# Patient Record
Sex: Female | Born: 1990 | Race: White | Hispanic: No | Marital: Single | Smoking: Former smoker
Health system: Southern US, Community
[De-identification: ages and names within clinical notes are randomized; demographics above are authoritative.]

## PROBLEM LIST (undated history)

## (undated) DIAGNOSIS — F410 Panic disorder [episodic paroxysmal anxiety] without agoraphobia: Secondary | ICD-10-CM

## (undated) DIAGNOSIS — D649 Anemia, unspecified: Secondary | ICD-10-CM

## (undated) DIAGNOSIS — R011 Cardiac murmur, unspecified: Secondary | ICD-10-CM

## (undated) DIAGNOSIS — F419 Anxiety disorder, unspecified: Secondary | ICD-10-CM

## (undated) DIAGNOSIS — R000 Tachycardia, unspecified: Secondary | ICD-10-CM

## (undated) DIAGNOSIS — F32A Depression, unspecified: Secondary | ICD-10-CM

## (undated) DIAGNOSIS — F329 Major depressive disorder, single episode, unspecified: Secondary | ICD-10-CM

## (undated) DIAGNOSIS — T7840XA Allergy, unspecified, initial encounter: Secondary | ICD-10-CM

## (undated) HISTORY — DX: Allergy, unspecified, initial encounter: T78.40XA

## (undated) HISTORY — DX: Major depressive disorder, single episode, unspecified: F32.9

## (undated) HISTORY — DX: Depression, unspecified: F32.A

## (undated) HISTORY — DX: Tachycardia, unspecified: R00.0

## (undated) HISTORY — DX: Anemia, unspecified: D64.9

## (undated) HISTORY — DX: Cardiac murmur, unspecified: R01.1

---
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
## 2012-03-16 ENCOUNTER — Ambulatory Visit (INDEPENDENT_AMBULATORY_CARE_PROVIDER_SITE_OTHER): Payer: PRIVATE HEALTH INSURANCE | Admitting: Physician Assistant

## 2012-03-16 VITALS — BP 102/58 | HR 98 | Temp 99.0°F | Resp 18 | Ht 65.5 in | Wt 148.6 lb

## 2012-03-16 DIAGNOSIS — J020 Streptococcal pharyngitis: Secondary | ICD-10-CM

## 2012-03-16 DIAGNOSIS — J029 Acute pharyngitis, unspecified: Secondary | ICD-10-CM

## 2012-03-16 DIAGNOSIS — R109 Unspecified abdominal pain: Secondary | ICD-10-CM

## 2012-03-16 DIAGNOSIS — R509 Fever, unspecified: Secondary | ICD-10-CM

## 2012-03-16 LAB — POCT URINALYSIS DIPSTICK
Bilirubin, UA: NEGATIVE
Blood, UA: NEGATIVE
Glucose, UA: NEGATIVE
Ketones, UA: NEGATIVE
Leukocytes, UA: NEGATIVE
Nitrite, UA: NEGATIVE
Protein, UA: NEGATIVE
Spec Grav, UA: 1.02
Urobilinogen, UA: 0.2
pH, UA: 8

## 2012-03-16 LAB — POCT UA - MICROSCOPIC ONLY
Bacteria, U Microscopic: NEGATIVE
Casts, Ur, LPF, POC: NEGATIVE
Crystals, Ur, HPF, POC: NEGATIVE
Mucus, UA: NEGATIVE
Yeast, UA: NEGATIVE

## 2012-03-16 LAB — POCT CBC
Granulocyte percent: 77.2 %G (ref 37–80)
HCT, POC: 41.5 % (ref 37.7–47.9)
Hemoglobin: 12.8 g/dL (ref 12.2–16.2)
Lymph, poc: 1.9 (ref 0.6–3.4)
MCH, POC: 29.1 pg (ref 27–31.2)
MCHC: 30.8 g/dL — AB (ref 31.8–35.4)
MCV: 94.3 fL (ref 80–97)
MID (cbc): 0.8 (ref 0–0.9)
MPV: 7.9 fL (ref 0–99.8)
POC Granulocyte: 9.1 — AB (ref 2–6.9)
POC LYMPH PERCENT: 16.1 %L (ref 10–50)
POC MID %: 6.7 %M (ref 0–12)
Platelet Count, POC: 286 10*3/uL (ref 142–424)
RBC: 4.4 M/uL (ref 4.04–5.48)
RDW, POC: 13.6 %
WBC: 11.8 10*3/uL — AB (ref 4.6–10.2)

## 2012-03-16 LAB — POCT RAPID STREP A (OFFICE): Rapid Strep A Screen: POSITIVE — AB

## 2012-03-16 MED ORDER — AMOXICILLIN 875 MG PO TABS: 875.0000 mg | ORAL_TABLET | Freq: Two times a day (BID) | ORAL | Status: DC

## 2012-03-16 NOTE — Patient Instructions (Signed)
Take Motrin or Tylenol as needed for fever and body aches Mucinex twice daily for congestion Return if symptoms worsen or fail to improve, especially if abdominal pain worsens

## 2012-03-16 NOTE — Progress Notes (Signed)
Subjective:    Patient ID: Kara Downs, female    DOB: [birth year], 22 y.o.   MRN: [id number]  HPI 22 year old female presents with 4 day history of sore throat, slight nasal congestion, cough, nausea, and diffuse abdominal tenderness.  Complains of tonsillar swelling with right sided otalgia and popping.  Complains of headache and chills with body aches.  Denies nausea, sinus pain, dizziness, or SOB.  She does have a history of strep infections with the last about 1 year ago.  Admits that her tonsils are always enlarged and she has considered having them out, but has not seen an ENT about it yet.  She has taken Tylenol Cold & Sinus which has helped some with her congestion.    History of anxiety/depression treated with Ativan prn which she admits has offered better control than she previously had.      Review of Systems  Constitutional: Positive for fever and chills.  HENT: Positive for congestion, sore throat, rhinorrhea and postnasal drip. Negative for ear pain, neck pain and sinus pressure.   Respiratory: Positive for cough. Negative for shortness of breath and wheezing.   Gastrointestinal: Positive for nausea and abdominal pain. Negative for vomiting.  Neurological: Negative for dizziness.       Objective:   Physical Exam  Constitutional: She is oriented to person, place, and time. She appears well-developed and well-nourished.  HENT:  Head: Normocephalic and atraumatic.  Right Ear: Hearing, tympanic membrane, external ear and ear canal normal.  Left Ear: Hearing, tympanic membrane, external ear and ear canal normal.  Mouth/Throat: Uvula is midline and mucous membranes are normal. Oropharyngeal exudate and posterior oropharyngeal erythema (3+ tonsillar swelling) present. No posterior oropharyngeal edema or tonsillar abscesses.  Eyes: Conjunctivae are normal.  Neck: Normal range of motion. Neck supple.  Cardiovascular: Normal rate, regular rhythm and normal heart sounds.    Pulmonary/Chest: Effort normal and breath sounds normal.  Abdominal: Soft. Bowel sounds are normal. There is no hepatosplenomegaly. There is tenderness (diffuse abdominal tenderness). There is no rebound and no guarding.  Lymphadenopathy:    She has cervical adenopathy.  Neurological: She is alert and oriented to person, place, and time.  Psychiatric: She has a normal mood and affect. Her behavior is normal. Judgment and thought content normal.     Results for orders placed in visit on 03/16/12  POCT CBC      Result Value Range   WBC 11.8 (*) 4.6 - 10.2 K/uL   Lymph, poc 1.9  0.6 - 3.4   POC LYMPH PERCENT 16.1  10 - 50 %L   MID (cbc) 0.8  0 - 0.9   POC MID % 6.7  0 - 12 %M   POC Granulocyte 9.1 (*) 2 - 6.9   Granulocyte percent 77.2  37 - 80 %G   RBC 4.40  4.04 - 5.48 M/uL   Hemoglobin 12.8  12.2 - 16.2 g/dL   HCT, POC 40.9  81.1 - 47.9 %   MCV 94.3  80 - 97 fL   MCH, POC 29.1  27 - 31.2 pg   MCHC 30.8 (*) 31.8 - 35.4 g/dL   RDW, POC 91.4     Platelet Count, POC 286  142 - 424 K/uL   MPV 7.9  0 - 99.8 fL  POCT RAPID STREP A (OFFICE)      Result Value Range   Rapid Strep A Screen Positive (*) Negative  POCT URINALYSIS DIPSTICK      Result Value  Range   Color, UA yellow     Clarity, UA clear     Glucose, UA neg     Bilirubin, UA neg     Ketones, UA neg     Spec Grav, UA 1.020     Blood, UA neg     pH, UA 8.0     Protein, UA neg     Urobilinogen, UA 0.2     Nitrite, UA neg     Leukocytes, UA Negative    POCT UA - MICROSCOPIC ONLY      Result Value Range   WBC, Ur, HPF, POC 0-1     RBC, urine, microscopic 0-1     Bacteria, U Microscopic neg     Mucus, UA neg     Epithelial cells, urine per micros 0-1     Crystals, Ur, HPF, POC neg     Casts, Ur, LPF, POC neg     Yeast, UA neg          Assessment & Plan:  1. Streptococcal sore throat - Plan: amoxicillin (AMOXIL) 875 MG tablet, Ambulatory referral to ENT  -Amoxicillin 875 mg bid x 10 days  -Referral to ENT   2. Fever, unspecified - Plan: POCT CBC  -Tylenol or ibuprofen prn chills/body aches 3. Acute pharyngitis - Plan: POCT CBC, POCT rapid strep A, Ambulatory referral to ENT 4. Abdominal pain, unspecified site - Plan: POCT urinalysis dipstick, POCT UA - Microscopic Only  -Likely secondary to strep infection.    -RTC or go to ER with any worsening symptoms

## 2012-04-24 ENCOUNTER — Encounter: Payer: Self-pay | Admitting: Physician Assistant

## 2012-04-24 ENCOUNTER — Ambulatory Visit: Payer: PRIVATE HEALTH INSURANCE

## 2012-04-25 ENCOUNTER — Ambulatory Visit (INDEPENDENT_AMBULATORY_CARE_PROVIDER_SITE_OTHER): Payer: PRIVATE HEALTH INSURANCE | Admitting: Physician Assistant

## 2012-04-25 VITALS — BP 106/70 | HR 82 | Temp 98.1°F | Resp 16 | Ht 65.75 in | Wt 152.6 lb

## 2012-04-25 DIAGNOSIS — J029 Acute pharyngitis, unspecified: Secondary | ICD-10-CM

## 2012-04-25 DIAGNOSIS — J020 Streptococcal pharyngitis: Secondary | ICD-10-CM

## 2012-04-25 DIAGNOSIS — J309 Allergic rhinitis, unspecified: Secondary | ICD-10-CM

## 2012-04-25 LAB — POCT RAPID STREP A (OFFICE): Rapid Strep A Screen: POSITIVE — AB

## 2012-04-25 MED ORDER — FLUTICASONE PROPIONATE 50 MCG/ACT NA SUSP: 2.0000 | Freq: Every day | NASAL | Status: DC

## 2012-04-25 MED ORDER — CETIRIZINE HCL 10 MG PO TABS: 10.0000 mg | ORAL_TABLET | Freq: Every day | ORAL | Status: DC

## 2012-04-25 MED ORDER — PENICILLIN G BENZATHINE 1200000 UNIT/2ML IM SUSP
1.2000 10*6.[IU] | Freq: Once | INTRAMUSCULAR | Status: AC
  Administered 2012-04-25: 1.2 10*6.[IU] via INTRAMUSCULAR

## 2012-04-25 NOTE — Progress Notes (Signed)
Subjective:    Patient ID: Kara Downs, female    DOB: 1990-09-25, 22 y.o.   MRN: [id number]  HPI   Ms. Scahill is a 22 yr old female here with concern for illness.  States "I felt like death when I woke up."  Complains of sore throat, nasal congestion, post-nasal drainage, cough.  "Feels like I'm coughing up liquid."  Denies SOB or wheezing.  Feels fatigued.  "I always have muscle aches."  Denies ear pain or headache.  Does endorse nausea but no vomiting.  Denies sick contacts.  States that she has allergies but does not take any medication for this.  States she was running a fever today but does not have a thermometer and did not take her temp.  States she "definitely felt warm" though.    Of note, pt treated here for strep approx 5 weeks ago.  Pt states she did get completely better from this episode.  States she did take all of her medication, but then admits that she did not take it as directed.  Missed doses "here and there", but eventually took all of the pills.    Review of Systems  Constitutional: Positive for fever (subjective, not measured). Negative for chills.  HENT: Positive for congestion, sore throat, rhinorrhea and postnasal drip. Negative for ear pain.   Respiratory: Positive for cough. Negative for shortness of breath and wheezing.   Cardiovascular: Negative.   Gastrointestinal: Positive for nausea. Negative for vomiting, abdominal pain and diarrhea.  Musculoskeletal: Positive for myalgias.  Skin: Negative.   Allergic/Immunologic: Positive for environmental allergies.  Neurological: Negative.        Objective:   Physical Exam  Vitals reviewed. Constitutional: She is oriented to person, place, and time. She appears well-developed and well-nourished. No distress.  HENT:  Head: Normocephalic and atraumatic.  Right Ear: Tympanic membrane and ear canal normal.  Left Ear: Tympanic membrane and ear canal normal.  Nose: Mucosal edema and rhinorrhea present.  Mouth/Throat: Uvula  is midline and mucous membranes are normal. Oropharyngeal exudate (small amount of white tonsillar exudate) and posterior oropharyngeal erythema present. No posterior oropharyngeal edema or tonsillar abscesses.  Eyes: Conjunctivae are normal. No scleral icterus.  Neck: Neck supple.  Cardiovascular: Normal rate, regular rhythm and normal heart sounds.  Exam reveals no gallop and no friction rub.   No murmur heard. Pulmonary/Chest: Effort normal and breath sounds normal. She has no wheezes. She has no rales.  Lymphadenopathy:    She has no cervical adenopathy.  Neurological: She is alert and oriented to person, place, and time.  Skin: Skin is warm and dry.  Psychiatric: She has a normal mood and affect. Her behavior is normal.     Filed Vitals:   04/25/12 2031  BP: 106/70  Pulse: 82  Temp: 98.1 F (36.7 C)  Resp: 16     Results for orders placed in visit on 04/25/12  POCT RAPID STREP A (OFFICE)      Result Value Range   Rapid Strep A Screen Positive (*) Negative        Assessment & Plan:  Strep pharyngitis - Plan: penicillin g benzathine (BICILLIN LA) 1200000 UNIT/2ML injection 1.2 Million Units  -- Pt with 1 day of sore throat and URI symptoms.  History of strep, most recently 03/16/12.  May have been incompletely treated at that time as pt admits to not taking meds as prescribed.  Will treat with pen g x1 in clinic today.  It is also possible that  pt may be a carrier of strep.  I have not done a throat culture today, but will have pt RTC in about 10 days when asymptomatic to perform throat cx at that time to confirm eradication of infection or carrier state.  Encouraged pt to practice good hand hygiene to prevent spread.  New tooth brush.  Push fluids, rest.  Work note given.    Sore throat - Plan: POCT rapid strep A  -- See above.  Possible that this is multifactorial.  May have allergic component given pt's other allergic symptoms.    Allergic rhinitis - Plan: fluticasone  (FLONASE) 50 MCG/ACT nasal spray, cetirizine (ZYRTEC) 10 MG tablet  --  Pt admits to untreated allergies.  Will start  Zyrtec and Flonase.    Discussed RTC precautions with pt who understand and is in agreement.

## 2012-04-25 NOTE — Patient Instructions (Addendum)
We have given you an injection of antibiotics in clinic today, so this should take care of your throat infection.  Make sure you are practicing good hand hygiene, not sharing food or drink.  Get a new tooth brush.  Come back here in about 10 days when your symptoms are completely gone so we can test to see if you are a carrier of strep.    Begin taking Zyrtec once daily for allergies.  Also begin using the Flonase daily to help control allergy symptoms.     Strep Throat Strep throat is an infection of the throat caused by a bacteria named Streptococcus pyogenes. Your caregiver may call the infection streptococcal "tonsillitis" or "pharyngitis" depending on whether there are signs of inflammation in the tonsils or back of the throat. Strep throat is most common in children from 88 to 55 years old during the cold months of the year, but it can occur in people of any age during any season. This infection is spread from person to person (contagious) through coughing, sneezing, or other close contact. SYMPTOMS   Fever or chills.  Painful, swollen, red tonsils or throat.  Pain or difficulty when swallowing.  White or yellow spots on the tonsils or throat.  Swollen, tender lymph nodes or "glands" of the neck or under the jaw.  Red rash all over the body (rare). DIAGNOSIS  Many different infections can cause the same symptoms. A test must be done to confirm the diagnosis so the right treatment can be given. A "rapid strep test" can help your caregiver make the diagnosis in a few minutes. If this test is not available, a light swab of the infected area can be used for a throat culture test. If a throat culture test is done, results are usually available in a day or two. TREATMENT  Strep throat is treated with antibiotic medicine. HOME CARE INSTRUCTIONS   Gargle with 1 tsp of salt in 1 cup of warm water, 3 to 4 times per day or as needed for comfort.  Family members who also have a sore throat or  fever should be tested for strep throat and treated with antibiotics if they have the strep infection.  Make sure everyone in your household washes their hands well.  Do not share food, drinking cups, or personal items that could cause the infection to spread to others.  You may need to eat a soft food diet until your sore throat gets better.  Drink enough water and fluids to keep your urine clear or pale yellow. This will help prevent dehydration.  Get plenty of rest.  Stay home from school, daycare, or work until you have been on antibiotics for 24 hours.  Only take over-the-counter or prescription medicines for pain, discomfort, or fever as directed by your caregiver.  If antibiotics are prescribed, take them as directed. Finish them even if you start to feel better. SEEK MEDICAL CARE IF:   The glands in your neck continue to enlarge.  You develop a rash, cough, or earache.  You cough up green, yellow-brown, or bloody sputum.  You have pain or discomfort not controlled by medicines.  Your problems seem to be getting worse rather than better. SEEK IMMEDIATE MEDICAL CARE IF:   You develop any new symptoms such as vomiting, severe headache, stiff or painful neck, chest pain, shortness of breath, or trouble swallowing.  You develop severe throat pain, drooling, or changes in your voice.  You develop swelling of the neck,  or the skin on the neck becomes red and tender.  You have a fever.  You develop signs of dehydration, such as fatigue, dry mouth, and decreased urination.  You become increasingly sleepy, or you cannot wake up completely. Document Released: 12/19/1999 Document Revised: 03/15/2011 Document Reviewed: 02/19/2010 [facility] Patient Information 2013 [place], [place].    Allergic Rhinitis Allergic rhinitis is when the mucous membranes in the nose respond to allergens. Allergens are particles in the air that cause your body to have an allergic reaction. This  causes you to release allergic antibodies. Through a chain of events, these eventually cause you to release histamine into the blood stream (hence the use of antihistamines). Although meant to be protective to the body, it is this release that causes your discomfort, such as frequent sneezing, congestion and an itchy runny nose.  CAUSES  The pollen allergens may come from grasses, trees, and weeds. This is seasonal allergic rhinitis, or "hay fever." Other allergens cause year-round allergic rhinitis (perennial allergic rhinitis) such as house dust mite allergen, pet dander and mold spores.  SYMPTOMS   Nasal stuffiness (congestion).  Runny, itchy nose with sneezing and tearing of the eyes.  There is often an itching of the mouth, eyes and ears. It cannot be cured, but it can be controlled with medications. DIAGNOSIS  If you are unable to determine the offending allergen, skin or blood testing may find it. TREATMENT   Avoid the allergen.  Medications and allergy shots (immunotherapy) can help.  Hay fever may often be treated with antihistamines in pill or nasal spray forms. Antihistamines block the effects of histamine. There are over-the-counter medicines that may help with nasal congestion and swelling around the eyes. Check with your caregiver before taking or giving this medicine. If the treatment above does not work, there are many new medications your caregiver can prescribe. Stronger medications may be used if initial measures are ineffective. Desensitizing injections can be used if medications and avoidance fails. Desensitization is when a patient is given ongoing shots until the body becomes less sensitive to the allergen. Make sure you follow up with your caregiver if problems continue. SEEK MEDICAL CARE IF:   You develop fever (more than 100.5 F (38.1 C).  You develop a cough that does not stop easily (persistent).  You have shortness of breath.  You start  wheezing.  Symptoms interfere with normal daily activities. Document Released: 09/15/2000 Document Revised: 03/15/2011 Document Reviewed: 03/27/2008 [facility] Patient Information 2013 [place], [place].

## 2012-05-02 ENCOUNTER — Ambulatory Visit (INDEPENDENT_AMBULATORY_CARE_PROVIDER_SITE_OTHER): Payer: PRIVATE HEALTH INSURANCE | Admitting: Physician Assistant

## 2012-05-02 VITALS — BP 112/74 | HR 76 | Temp 98.8°F | Resp 16 | Ht 66.5 in | Wt 155.0 lb

## 2012-05-02 DIAGNOSIS — B373 Candidiasis of vulva and vagina: Secondary | ICD-10-CM

## 2012-05-02 DIAGNOSIS — Z202 Contact with and (suspected) exposure to infections with a predominantly sexual mode of transmission: Secondary | ICD-10-CM

## 2012-05-02 DIAGNOSIS — N898 Other specified noninflammatory disorders of vagina: Secondary | ICD-10-CM

## 2012-05-02 DIAGNOSIS — B3731 Acute candidiasis of vulva and vagina: Secondary | ICD-10-CM

## 2012-05-02 DIAGNOSIS — R300 Dysuria: Secondary | ICD-10-CM

## 2012-05-02 DIAGNOSIS — Z2089 Contact with and (suspected) exposure to other communicable diseases: Secondary | ICD-10-CM

## 2012-05-02 DIAGNOSIS — Z862 Personal history of diseases of the blood and blood-forming organs and certain disorders involving the immune mechanism: Secondary | ICD-10-CM

## 2012-05-02 LAB — POCT CBC
Granulocyte percent: 67.1 %G (ref 37–80)
HCT, POC: 40.9 % (ref 37.7–47.9)
Hemoglobin: 12.6 g/dL (ref 12.2–16.2)
Lymph, poc: 1.7 (ref 0.6–3.4)
MCH, POC: 28.7 pg (ref 27–31.2)
MCHC: 30.8 g/dL — AB (ref 31.8–35.4)
MCV: 93.1 fL (ref 80–97)
MID (cbc): 0.5 (ref 0–0.9)
MPV: 7.6 fL (ref 0–99.8)
POC Granulocyte: 4.4 (ref 2–6.9)
POC LYMPH PERCENT: 25.1 %L (ref 10–50)
POC MID %: 7.8 %M (ref 0–12)
Platelet Count, POC: 314 10*3/uL (ref 142–424)
RBC: 4.39 M/uL (ref 4.04–5.48)
RDW, POC: 13.5 %
WBC: 6.6 10*3/uL (ref 4.6–10.2)

## 2012-05-02 LAB — RPR

## 2012-05-02 LAB — POCT URINALYSIS DIPSTICK
Bilirubin, UA: NEGATIVE
Glucose, UA: NEGATIVE
Nitrite, UA: NEGATIVE

## 2012-05-02 LAB — POCT WET PREP WITH KOH
Clue Cells Wet Prep HPF POC: NEGATIVE
KOH Prep POC: POSITIVE
RBC Wet Prep HPF POC: NEGATIVE
Trichomonas, UA: NEGATIVE
Yeast Wet Prep HPF POC: POSITIVE

## 2012-05-02 LAB — POCT UA - MICROSCOPIC ONLY
Mucus, UA: POSITIVE
Yeast, UA: NEGATIVE

## 2012-05-02 LAB — HIV ANTIBODY (ROUTINE TESTING W REFLEX): HIV: NONREACTIVE

## 2012-05-02 MED ORDER — FLUCONAZOLE 150 MG PO TABS: 150.0000 mg | ORAL_TABLET | Freq: Once | ORAL | Status: DC
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
## 2012-05-03 LAB — GC/CHLAMYDIA PROBE AMP
CT Probe RNA: NEGATIVE
GC Probe RNA: NEGATIVE

## 2012-05-04 LAB — URINE CULTURE
Colony Count: NO GROWTH
Organism ID, Bacteria: NO GROWTH

## 2012-05-04 LAB — CULTURE, GROUP A STREP: Organism ID, Bacteria: NORMAL

## 2012-08-09 ENCOUNTER — Encounter (HOSPITAL_COMMUNITY): Payer: Self-pay | Admitting: Emergency Medicine

## 2012-08-09 ENCOUNTER — Emergency Department (HOSPITAL_COMMUNITY)
Admission: EM | Admit: 2012-08-09 | Discharge: 2012-08-10 | Disposition: A | Discharge status: Home / Self Care | Payer: PRIVATE HEALTH INSURANCE | Attending: Emergency Medicine | Admitting: Emergency Medicine

## 2012-08-09 DIAGNOSIS — Z8659 Personal history of other mental and behavioral disorders: Secondary | ICD-10-CM | POA: Insufficient documentation

## 2012-08-09 DIAGNOSIS — Z87891 Personal history of nicotine dependence: Secondary | ICD-10-CM | POA: Insufficient documentation

## 2012-08-09 DIAGNOSIS — S01511A Laceration without foreign body of lip, initial encounter: Secondary | ICD-10-CM

## 2012-08-09 DIAGNOSIS — Z91040 Latex allergy status: Secondary | ICD-10-CM | POA: Insufficient documentation

## 2012-08-09 DIAGNOSIS — Z8679 Personal history of other diseases of the circulatory system: Secondary | ICD-10-CM | POA: Insufficient documentation

## 2012-08-09 DIAGNOSIS — S01501A Unspecified open wound of lip, initial encounter: Secondary | ICD-10-CM | POA: Insufficient documentation

## 2012-08-09 DIAGNOSIS — W540XXA Bitten by dog, initial encounter: Secondary | ICD-10-CM | POA: Insufficient documentation

## 2012-08-09 DIAGNOSIS — F411 Generalized anxiety disorder: Secondary | ICD-10-CM | POA: Insufficient documentation

## 2012-08-09 DIAGNOSIS — R011 Cardiac murmur, unspecified: Secondary | ICD-10-CM | POA: Insufficient documentation

## 2012-08-09 DIAGNOSIS — Y9389 Activity, other specified: Secondary | ICD-10-CM | POA: Insufficient documentation

## 2012-08-09 DIAGNOSIS — F410 Panic disorder [episodic paroxysmal anxiety] without agoraphobia: Secondary | ICD-10-CM | POA: Insufficient documentation

## 2012-08-09 DIAGNOSIS — Z23 Encounter for immunization: Secondary | ICD-10-CM | POA: Insufficient documentation

## 2012-08-09 DIAGNOSIS — Z79899 Other long term (current) drug therapy: Secondary | ICD-10-CM | POA: Insufficient documentation

## 2012-08-09 DIAGNOSIS — R209 Unspecified disturbances of skin sensation: Secondary | ICD-10-CM | POA: Insufficient documentation

## 2012-08-09 DIAGNOSIS — Z862 Personal history of diseases of the blood and blood-forming organs and certain disorders involving the immune mechanism: Secondary | ICD-10-CM | POA: Insufficient documentation

## 2012-08-09 DIAGNOSIS — Y929 Unspecified place or not applicable: Secondary | ICD-10-CM | POA: Insufficient documentation

## 2012-08-09 MED ORDER — TETANUS-DIPHTH-ACELL PERTUSSIS 5-2.5-18.5 LF-MCG/0.5 IM SUSP
0.5000 mL | Freq: Once | INTRAMUSCULAR | Status: AC
Start: 2012-08-09 — End: 2012-08-09
  Administered 2012-08-09: 0.5 mL via INTRAMUSCULAR
  Filled 2012-08-09: qty 0.5

## 2012-08-09 MED ORDER — IBUPROFEN 800 MG PO TABS
800.0000 mg | ORAL_TABLET | Freq: Once | ORAL | Status: AC
  Administered 2012-08-09: 800 mg via ORAL
  Filled 2012-08-09: qty 1

## 2012-08-09 NOTE — Consult Note (Signed)
Oral & Maxillofacial Surgery-Consult Note  Reason for Consult: Dog bite to left upper lip Referring Physician: Dr. Luiz Iron Tadros is an 22 y.o. female.  HPI: Kara Downs explains that she was visiting a friend and the friends [place] Retriever bit her upper lip.    PMHx:  Past Medical History  Diagnosis Date  . Panic attacks   . Anxiety   . Allergy   . Depression   . Anemia   . Heart murmur   . Tachycardia     PSx: History reviewed. No pertinent past surgical history.  Family Hx:  Family History  Problem Relation Age of Onset  . Migraines Mother   . Migraines Father   . Migraines Sister   . Migraines Brother   . Asthma Brother     Social Hx:  reports that she has quit smoking. She does not have any smokeless tobacco history on file. She reports that  drinks alcohol. She reports that she does not use illicit drugs.  Allergies:  Allergies  Allergen Reactions  . Latex Rash    Medications: I have reviewed the patient's current medications.  Labs: No results found for this or any previous visit (from the past 48 hour(s)).  Radiology: No results found.  WUJ:[id number] items are noted in HPI.  Vital Signs: BP 129/87  Pulse 100  Resp 19  SpO2 100%  Physical Exam: General appearance: alert and cooperative Head: Normocephalic, laceration of upper right lip involving vermillion border, avulsive injury (2 to  3 cm of tissue missing). The laceration extends down to muscle.  Assessment/Plan: Kara Downs has a 3 cm complex laceration/avulsion involving the vermilion border of the upper lip. 1. Lidocaine 2% with 1:100,000 epinephrine was injected into the labial mucosa - 10 mL. 2. Copious irrigation with Normal Saline. 3. Closure using 4-0 vicryl, 4-0 chromic, and 6-0 prolene sutures.   4. Bacitracin placed 5. Recommend follow up in one week, soft diet, and NO smoking.   6. Recommend the patient be covered with Antibiotic per ED.   Kara Downs,Kara Downs L  08/09/2012,  10:37 PM

## 2012-08-09 NOTE — ED Provider Notes (Signed)
Kara Downs is a 22 y.o. female who was bitten by a known dog. Animal control has been contacted. She cannot recall her last tetanus immunization. She complains of injuries to the upper lip only.  Exam:: Irregular laceration of the mid upper lip, there is a tissue defect, consistent with avulsion of tissue, approximately 1.5 by 1.5 cm, with associated flap lacerations, of the tissue, consisting of the dry area of the upper lip and crossing the vermilion border. This is a complex laceration. She resists exam making assessment for through and through laceration, difficult. There is also a small puncture adjacent to the left ala of the nose. TMJ function, and jaw motion is normal. There is no evident dental injury on a limited oral exam.  Assessment: Complex facial injury, requiring services of maxillofacial trauma for operative repair.  I discussed the case with Dr. Jeanice Lim (oral maxillofacial , trauma surgeon)  who will see the patient in the emergency department.  Medical screening examination/treatment/procedure(s) were conducted as a shared visit with non-physician practitioner(s) and myself.  I personally evaluated the patient during the encounter  Flint Melter, MD 08/10/12 [phone]

## 2012-08-09 NOTE — ED Provider Notes (Signed)
CSN: [id number]     Arrival date & time 08/09/12  1624 History    This chart was scribed for Raymon Mutton, PA working with Flint Melter, MD by Quintella Reichert, ED Scribe. This patient was seen in room WTR7/WTR7 and the patient's care was started at 6:06 PM.     Chief Complaint  Patient presents with  . Animal Bite    The history is provided by the patient. No language interpreter was used.    HPI Comments: Kara Downs is a 22 y.o. female who presents to the Emergency Department complaining of a facial laceration subsequent to an animal bite that she sustained 1 hour ago.  Pt was petting her friend's Location manager when it jumped up and bit her in the face.  The dog is up to date on all vaccinations and rabies shots.  Pt now presents with a moderately painful laceration to the left side of her upper lip.  She also reports numbness and tingling to the area.  She denies difficulty breathing, SOB, CP, or any other associated symptoms.  Her last tetanus vaccination was 8 years ago.   Past Medical History  Diagnosis Date  . Panic attacks   . Anxiety   . Allergy   . Depression   . Anemia   . Heart murmur   . Tachycardia     History reviewed. No pertinent past surgical history.   Family History  Problem Relation Age of Onset  . Migraines Mother   . Migraines Father   . Migraines Sister   . Migraines Brother   . Asthma Brother     History  Substance Use Topics  . Smoking status: Former Smoker -- 1.00 packs/day for 5 years  . Smokeless tobacco: Not on file  . Alcohol Use: Yes    OB History   Grav Para Term Preterm Abortions TAB SAB Ect Mult Living                   Review of Systems  Respiratory: Negative for shortness of breath.   Cardiovascular: Negative for chest pain.  Skin: Positive for wound.  Neurological: Positive for numbness.  All other systems reviewed and are negative.      Allergies  Latex  Home Medications   Current Outpatient Rx   Name  Route  Sig  Dispense  Refill  . LORazepam (ATIVAN) 1 MG tablet   Oral   Take 1 mg by mouth every 8 (eight) hours as needed for anxiety.          BP 129/87  Pulse 100  Resp 19  SpO2 100%  Physical Exam  Nursing note and vitals reviewed. Constitutional: She is oriented to person, place, and time. She appears well-developed and well-nourished. No distress.  HENT:  Head: Normocephalic.  Mouth/Throat:    Laceration to the left upper lip, affecting the vermilion border. Still currently bleeding. Two flaps of skin noted. Subcutaneous tissue exposed. Appears that some tissue is missing from the wound. Pain upon palpation. Decreased opening of jaw secondary to pain - negative pain upon palpation to jaw line bilaterally. Negative trismus. Negative damage to dentition.  Small abrasion noted to the left nasolabial fold.  Patient has two hoop piercings to lower lip    Eyes: Conjunctivae and EOM are normal. Pupils are equal, round, and reactive to light. Right eye exhibits no discharge. Left eye exhibits no discharge.  Neck: Normal range of motion. Neck supple. No tracheal deviation present.  Cardiovascular:  Normal rate, regular rhythm and normal heart sounds.   No murmur heard. Pulses:      Radial pulses are 2+ on the right side, and 2+ on the left side.  Pulmonary/Chest: Effort normal and breath sounds normal. No respiratory distress. She has no wheezes. She has no rales.  Musculoskeletal: Normal range of motion.  Neurological: She is alert and oriented to person, place, and time. No cranial nerve deficit or sensory deficit. She exhibits normal muscle tone. Coordination normal.  Cranial nerves III-XII grossly intact  Sensation intact to the face with differentiation to sharp and dull touch to the upper lip, bottom lip, face  Skin: Skin is warm and dry.  Psychiatric: Her behavior is normal.  Patient anxious and nervous regarding facial injury    ED Course  Procedures (including  critical care time)  DIAGNOSTIC STUDIES: Oxygen Saturation is 100% on room air, normal by my interpretation.    COORDINATION OF CARE: 6:12 PM-Discussed treatment plan which includes evaluation by attending provider with pt at bedside and pt agreed to plan.   6:20PM Spoke with Dr. Bernell List - went to go and assess patient - recommended Maxillofacial Trauma ENT - Dr. Bernell List to call Maxillofacial surgery.  Dr. Bernell List spoke with Dr. Jeanice Lim - maxillofacial - Dr. Jeanice Lim to see patient in ED for closure of wound.   Medications  TDaP (BOOSTRIX) injection 0.5 mL (0.5 mLs Intramuscular Given 08/09/12 1928)  ibuprofen (ADVIL,MOTRIN) tablet 800 mg (800 mg Oral Given 08/09/12 1928)    Labs Reviewed - No data to display  No results found.  No diagnosis found.  MDM  I personally performed the services described in this documentation, which was scribed in my presence. The recorded information has been reviewed and is accurate.  Patient presenting to the ED with animal bite, by friend's labrador, to the face that occurred approximately 1 hour and 30 minutes prior to ED arrival. Reported that she was petting the dog, first time meeting, put face near dogs face and dog bite patient. Reported instant pain and mild numbness.  Wound to the left side of the upper lift, affecting there vermilion border - appears to have tissue missing from site, flapped tissue noted. Swelling noted to the lip without ecchymosis. Limited opening of the mouth secondary to pain with pulling and tension of the upper lip - negative pain upon palpation to the jaw - negative trismus. Negative damage noted to dentition. Sensation intact with differentiation to sharp and dull touch - negative neurological deficits. Patient denied Tdap since 22 years old - Tdap administered. Bleeding under control with time - swelling has decreased with ice. Animal control has been notified about case. Dog has been vaccinated for rabies.  Dr. Bernell List  assessed patient - recommended maxillofacial surgery - Dr. Bernell List spoke with Dr. Jeanice Lim, Dr. Jeanice Lim to see patient in ED setting to close wound. Discussed with patient that she will most likely need a couple of procedures for the lip to look proper - discussed with patient that she will most likely be left with a scar.  Discussed case with Roxy Horseman, PA-C at change in shirt - transfer of care to Roxy Horseman, PA-C.   Raymon Mutton, PA-C 08/10/12 0136

## 2012-08-09 NOTE — ED Notes (Signed)
Pt was petting her friend's dog when the dog bit the pt's face. Pt has laceration on left side of upper lip.  Dog is up to date on all vaccines and rabies shots.

## 2012-08-10 MED ORDER — AMOXICILLIN-POT CLAVULANATE 875-125 MG PO TABS: 1.0000 | ORAL_TABLET | Freq: Two times a day (BID) | ORAL | Status: AC

## 2012-08-10 MED ORDER — IBUPROFEN 800 MG PO TABS: 800.0000 mg | ORAL_TABLET | Freq: Three times a day (TID) | ORAL | Status: AC

## 2012-08-10 NOTE — ED Provider Notes (Signed)
Patient signed out to me by [place], PA-C.    Plan is to have Dr. Jeanice Lim, from [facility] surgery see the patient.  12:04 AM Dr. Jeanice Lim has repaired the wound.  Recommends augmentin and something for pain.  Patient states that she does not want a narcotic, and is fine with ibuprofen.  Dr. Jeanice Lim recommends peroxide/water rinses, and would like the patient to follow up with him in a week.  Tdap is updated.  Dog had its rabies shots.  Patient is stable and ready for discharge.  Roxy Horseman, PA-C 08/10/12 [phone]

## 2012-08-10 NOTE — ED Provider Notes (Signed)
Medical screening examination/treatment/procedure(s) were conducted as a shared visit with non-physician practitioner(s) and myself.  I personally evaluated the patient during the encounter  Flint Melter, MD 08/10/12 1419

## 2012-08-10 NOTE — ED Provider Notes (Signed)
Medical screening examination/treatment/procedure(s) were conducted as a shared visit with non-physician practitioner(s) and myself.  I personally evaluated the patient during the encounter  Flint Melter, MD 08/10/12 [phone]

## 2014-03-18 ENCOUNTER — Other Ambulatory Visit: Payer: Self-pay | Admitting: Family Medicine

## 2014-03-18 ENCOUNTER — Other Ambulatory Visit (HOSPITAL_COMMUNITY)
Admission: RE | Admit: 2014-03-18 | Discharge: 2014-03-18 | Disposition: A | Discharge status: Home / Self Care | Payer: PRIVATE HEALTH INSURANCE | Source: Ambulatory Visit | Attending: Family Medicine | Admitting: Family Medicine

## 2014-03-18 DIAGNOSIS — Z124 Encounter for screening for malignant neoplasm of cervix: Secondary | ICD-10-CM | POA: Diagnosis not present

## 2014-03-18 DIAGNOSIS — N760 Acute vaginitis: Secondary | ICD-10-CM | POA: Diagnosis present

## 2014-03-18 DIAGNOSIS — Z113 Encounter for screening for infections with a predominantly sexual mode of transmission: Secondary | ICD-10-CM | POA: Diagnosis present

## 2014-03-19 LAB — CYTOLOGY - PAP

## 2015-05-05 ENCOUNTER — Emergency Department (HOSPITAL_COMMUNITY): Payer: No Typology Code available for payment source

## 2015-05-05 ENCOUNTER — Encounter (HOSPITAL_COMMUNITY): Payer: Self-pay | Admitting: Nurse Practitioner

## 2015-05-05 ENCOUNTER — Emergency Department (HOSPITAL_COMMUNITY)
Admission: EM | Admit: 2015-05-05 | Discharge: 2015-05-05 | Disposition: A | Discharge status: Home / Self Care | Payer: No Typology Code available for payment source | Attending: Emergency Medicine | Admitting: Emergency Medicine

## 2015-05-05 DIAGNOSIS — Z791 Long term (current) use of non-steroidal anti-inflammatories (NSAID): Secondary | ICD-10-CM | POA: Diagnosis not present

## 2015-05-05 DIAGNOSIS — Z792 Long term (current) use of antibiotics: Secondary | ICD-10-CM | POA: Insufficient documentation

## 2015-05-05 DIAGNOSIS — Z862 Personal history of diseases of the blood and blood-forming organs and certain disorders involving the immune mechanism: Secondary | ICD-10-CM | POA: Insufficient documentation

## 2015-05-05 DIAGNOSIS — Z3202 Encounter for pregnancy test, result negative: Secondary | ICD-10-CM | POA: Insufficient documentation

## 2015-05-05 DIAGNOSIS — K6289 Other specified diseases of anus and rectum: Secondary | ICD-10-CM | POA: Diagnosis not present

## 2015-05-05 DIAGNOSIS — R1031 Right lower quadrant pain: Secondary | ICD-10-CM | POA: Diagnosis present

## 2015-05-05 DIAGNOSIS — F410 Panic disorder [episodic paroxysmal anxiety] without agoraphobia: Secondary | ICD-10-CM | POA: Insufficient documentation

## 2015-05-05 DIAGNOSIS — Z91040 Latex allergy status: Secondary | ICD-10-CM | POA: Insufficient documentation

## 2015-05-05 DIAGNOSIS — N83201 Unspecified ovarian cyst, right side: Secondary | ICD-10-CM | POA: Diagnosis not present

## 2015-05-05 DIAGNOSIS — Z87891 Personal history of nicotine dependence: Secondary | ICD-10-CM | POA: Insufficient documentation

## 2015-05-05 DIAGNOSIS — R011 Cardiac murmur, unspecified: Secondary | ICD-10-CM | POA: Insufficient documentation

## 2015-05-05 DIAGNOSIS — R110 Nausea: Secondary | ICD-10-CM | POA: Diagnosis not present

## 2015-05-05 DIAGNOSIS — N83209 Unspecified ovarian cyst, unspecified side: Secondary | ICD-10-CM

## 2015-05-05 LAB — CBC
HCT: 39.4 % (ref 36.0–46.0)
HEMOGLOBIN: 12.8 g/dL (ref 12.0–15.0)
MCH: 29.6 pg (ref 26.0–34.0)
MCHC: 32.5 g/dL (ref 30.0–36.0)
MCV: 91 fL (ref 78.0–100.0)
Platelets: 255 10*3/uL (ref 150–400)
RBC: 4.33 MIL/uL (ref 3.87–5.11)
RDW: 12.4 % (ref 11.5–15.5)
WBC: 5.4 10*3/uL (ref 4.0–10.5)

## 2015-05-05 LAB — COMPREHENSIVE METABOLIC PANEL
ALBUMIN: 4.1 g/dL (ref 3.5–5.0)
ALK PHOS: 35 U/L — AB (ref 38–126)
ALT: 16 U/L (ref 14–54)
ANION GAP: 11 (ref 5–15)
AST: 22 U/L (ref 15–41)
BUN: 9 mg/dL (ref 6–20)
CALCIUM: 9.5 mg/dL (ref 8.9–10.3)
CHLORIDE: 107 mmol/L (ref 101–111)
CO2: 22 mmol/L (ref 22–32)
Creatinine, Ser: 0.68 mg/dL (ref 0.44–1.00)
GFR calc non Af Amer: >60 mL/min (ref >60)
GLUCOSE: 85 mg/dL (ref 65–99)
Potassium: 3.6 mmol/L (ref 3.5–5.1)
SODIUM: 140 mmol/L (ref 135–145)
Total Bilirubin: 0.5 mg/dL (ref 0.3–1.2)
Total Protein: 7.2 g/dL (ref 6.5–8.1)

## 2015-05-05 LAB — I-STAT BETA HCG BLOOD, ED (MC, WL, AP ONLY): I-stat hCG, quantitative: <5 m[IU]/mL (ref <5)

## 2015-05-05 LAB — URINALYSIS, ROUTINE W REFLEX MICROSCOPIC
BILIRUBIN URINE: NEGATIVE
GLUCOSE, UA: NEGATIVE mg/dL
HGB URINE DIPSTICK: NEGATIVE
Ketones, ur: 15 mg/dL — AB
Leukocytes, UA: NEGATIVE
Nitrite: NEGATIVE
PH: 5.5 (ref 5.0–8.0)
Protein, ur: NEGATIVE mg/dL
SPECIFIC GRAVITY, URINE: 1.007 (ref 1.005–1.030)

## 2015-05-05 MED ORDER — NAPROXEN 500 MG PO TABS: 500.0000 mg | ORAL_TABLET | Freq: Two times a day (BID) | ORAL | Status: AC

## 2015-05-05 MED ORDER — STERILE WATER FOR INJECTION IJ SOLN
INTRAMUSCULAR | Status: AC
  Administered 2015-05-05: 10 mL
  Filled 2015-05-05: qty 10

## 2015-05-05 MED ORDER — DOXYCYCLINE HYCLATE 100 MG PO CAPS: 100.0000 mg | ORAL_CAPSULE | Freq: Two times a day (BID) | ORAL | Status: AC

## 2015-05-05 MED ORDER — CEFTRIAXONE SODIUM 250 MG IJ SOLR
250.0000 mg | Freq: Once | INTRAMUSCULAR | Status: AC
  Administered 2015-05-05: 250 mg via INTRAMUSCULAR
  Filled 2015-05-05: qty 250

## 2015-05-05 MED ORDER — IOPAMIDOL (ISOVUE-300) INJECTION 61%
INTRAVENOUS | Status: AC
  Administered 2015-05-05: 100 mL
  Filled 2015-05-05: qty 100

## 2015-05-05 NOTE — Discharge Instructions (Signed)
[facility] call the [facility] to schedule a follow up appointment as soon as possible. Take antibiotics as prescribed. Return to the ER for new or worsening symptoms.   Proctitis Proctitis is swelling and soreness (inflammation) of the lining of the rectum. The rectum is at the end of the large intestine, and it leads to the anus. The inflammation causes pain and discomfort. It may be a short-term (acute) or long-lasting (chronic) problem. CAUSES This condition may be caused by:  STDs (sexually transmitted diseases).  Infection.  Trauma or injury to the anus or rectum.  Ulcerative colitis or Crohn disease.  Radiation therapy that is directed near the rectum.  Antibiotic therapy. SYMPTOMS Symptoms of this condition include:  Sudden, uncomfortable, and frequent urge to have a bowel movement.  Anal pain or rectal pain.  Pain or cramping in the abdomen.  Sensation that the rectum is full.  Rectal bleeding.  Pus or mucus discharge from the anus.  Diarrhea or frequent soft, loose stools.  Constipation.  Pain with bowel movements. DIAGNOSIS This condition may be diagnosed based on:  A medical history and physical exam.  Various tests, such as:  An STD test.  Blood tests.  Stool tests.  Rectal culture.  A procedure to evaluate the anal canal (anoscopy).  Procedures to look at the entire large bowel or part of it (colonoscopy or sigmoidoscopy). TREATMENT Treatment for this condition depends on the cause. The main goals of treatment are to reduce the symptoms of inflammation and to get rid of any infection. Treatment may include:  Home remedies and lifestyle changes, such as sitz baths and avoiding food right before bedtime.  Medicines, such as:  Topical ointments, foams, suppositories, or enemas, such as corticosteroids or anti-inflammatories.  Antibiotic or antiviral medicines to treat infection or to control harmful bacteria.  Medicines to  control diarrhea, soften stools, and reduce pain.  Medicines to suppress the immune system.  Nutritional, dietary, or herbal supplements.  Avoiding the activity that caused rectal trauma.  Heat or laser therapy for persistent bleeding.  A dilation procedure to enlarge a narrowed rectum.  Surgery to repair damaged rectal lining. This is rare. If your proctitis was caused by an STD, your health care provider may test you for infection again 3 months after treatment. HOME CARE INSTRUCTIONS  Take over-the-counter and prescription medicines only as told by your health care provider.  If you were prescribed an antibiotic medicine, take it as told by your health care provider. Do not stop taking the antibiotic even if you start to feel better.  Try to avoid eating right before bedtime.  Take sitz baths as told by your health care provider.  Keep all follow-up visits as told by your health care provider. This is important. SEEK MEDICAL CARE IF:  Your symptoms do not improve with treatment.  Your symptoms get worse.  You have a fever.   This information is not intended to replace advice given to you by your health care provider. Make sure you discuss any questions you have with your health care provider.   Document Released: 12/10/2010 Document Revised: 09/11/2014 Document Reviewed: 03/18/2014 Elsevier Interactive Patient Education [employer]2016 [employer]

## 2015-05-05 NOTE — ED Notes (Signed)
Pt c/o sudden onset lower abd/pelvic pain approximately 1 our ago. She reports nausea, diarrhea. Denies fevers, urinary changes. She reports scant vaginal bleeding but her last period ended on 4/28.

## 2015-05-05 NOTE — ED Provider Notes (Signed)
CSN: [id number]     Arrival date & time 05/05/15  1519 History  By signing my name below, I, [name], attest that this documentation has been prepared under the direction and in the presence of Ashleah Valtierra Y. Shenouda Genova, PA-C Electronically Signed: [name], ED Scribe. 05/05/2015. 4:58 PM.   Chief Complaint  Patient presents with  . Abdominal Pain      The history is provided by the patient. No language interpreter was used.    HPI Comments: Kara Downs is a 25 y.o. female who presents to the Emergency Department complaining of sudden onset right lower abdominal pain onset 1 hour ago PTA. Pt states that she was driving when she had a sudden onset of abdominal pain that caused her to pull over due to the pain and dry heaving. Pt reports that she had these symptoms several days ago. Pt notes that she now feels achy all over. She states that she is having associated symptoms of nausea, diarrhea, dry heaving, and mild vaginal bleeding. Patient's last menstrual period was 04/28/2015 and she thinks she is just having the last few days of light bleeding from her LMP. She states that she has not tried any medications for the relief for her symptoms. She denies vomiting, frequency, urgency, and any other symptoms.     Past Medical History  Diagnosis Date  . Panic attacks   . Anxiety   . Allergy   . Depression   . Anemia   . Heart murmur   . Tachycardia    History reviewed. No pertinent past surgical history. Family History  Problem Relation Age of Onset  . Migraines Mother   . Migraines Father   . Migraines Sister   . Migraines Brother   . Asthma Brother    Social History  Substance Use Topics  . Smoking status: Former Smoker -- 1.00 packs/day for 5 years  . Smokeless tobacco: None  . Alcohol Use: Yes     Comment: rare   OB History    No data available     Review of Systems  Gastrointestinal: Positive for nausea, abdominal pain and diarrhea. Negative for vomiting.  Genitourinary:  Positive for vaginal bleeding. Negative for urgency and frequency.  All other systems reviewed and are negative.     Allergies  Latex  Home Medications   Prior to Admission medications   Medication Sig Start Date End Date Taking? Authorizing Provider  amoxicillin-clavulanate (AUGMENTIN) 875-125 MG per tablet Take 1 tablet by mouth every 12 (twelve) hours. 08/10/12   Roxy Horseman, PA-C  ibuprofen (ADVIL,MOTRIN) 800 MG tablet Take 1 tablet (800 mg total) by mouth 3 (three) times daily. 08/10/12   Roxy Horseman, PA-C  LORazepam (ATIVAN) 1 MG tablet Take 1 mg by mouth every 8 (eight) hours as needed for anxiety.    Historical Provider, MD   BP 133/99 mmHg  Pulse 91  Temp(Src) 98.1 F (36.7 C) (Oral)  Resp 19  SpO2 100%  LMP 04/28/2015 Physical Exam  Constitutional: She is oriented to person, place, and time. She appears well-developed and well-nourished. No distress.  Appears unwell but NAD  HENT:  Head: Normocephalic and atraumatic.  Eyes: EOM are normal.  Neck: Neck supple.  Cardiovascular: Normal rate.   Pulmonary/Chest: Effort normal. No respiratory distress.  Abdominal: Soft. She exhibits no distension. There is tenderness. There is guarding. There is no rebound and no CVA tenderness.  +tenderness with guarding in all 4 quadrants particularly RLQ. No rebound. No CVA tenderness.  Musculoskeletal: Normal range of motion.  Neurological: She is alert and oriented to person, place, and time.  Skin: Skin is warm and dry.  Psychiatric: She has a normal mood and affect. Her behavior is normal.  Nursing note and vitals reviewed.   ED Course  Procedures (including critical care time) DIAGNOSTIC STUDIES: Oxygen Saturation is 100% on RA, nl by my interpretation.    COORDINATION OF CARE: 4:52 PM Discussed treatment plan with pt at bedside which includes UA, labs, CT abdomen pelvis and pt agreed to plan.    Labs Review Labs Reviewed  COMPREHENSIVE METABOLIC PANEL -  Abnormal; Notable for the following:    Alkaline Phosphatase 35 (*)    All other components within normal limits  URINALYSIS, ROUTINE W REFLEX MICROSCOPIC (NOT AT [facility]) - Abnormal; Notable for the following:    Ketones, ur 15 (*)    All other components within normal limits  CBC  I-STAT BETA HCG BLOOD, ED ([facility], [facility], [facility] ONLY)    Imaging Review Ct Abdomen Pelvis W Contrast  05/05/2015  CLINICAL DATA:  Abdominal pain with nausea EXAM: CT ABDOMEN AND PELVIS WITH CONTRAST TECHNIQUE: Multidetector CT imaging of the abdomen and pelvis was performed using the standard protocol following bolus administration of intravenous contrast. CONTRAST:  100mL ISOVUE-300 IOPAMIDOL (ISOVUE-300) INJECTION 61% COMPARISON:  None. FINDINGS: Lower chest:  Lung bases are clear. Hepatobiliary: Liver measures 17.5 cm in length. No focal liver lesions are apparent. Gallbladder wall is not appreciably thickened. There is no biliary duct dilatation. Pancreas: No pancreatic mass or inflammatory focus. Spleen: No splenic lesions are evident. Adrenals/Urinary Tract: Adrenals appear normal bilaterally. There is a 9 x 8 mm cyst arising from the lateral left mid kidney. There is no hydronephrosis on either side. There is no renal or ureteral calculus on either side. Urinary bladder is midline with wall thickness within normal limits. Stomach/Bowel: There is thickening of the wall of the rectum with mild perirectal stranding. There is no abscess in this area. There is no other appreciable bowel wall thickening. No other mesenteric thickening is noted. No bowel obstruction. No free air or portal venous air. Vascular/Lymphatic: There is no abdominal aortic aneurysm. No vascular lesions are evident. There is no adenopathy in the abdomen or pelvis. Reproductive: Uterus is anteverted. There is a dominant follicle in the left ovary measuring 1.6 x 1.5 cm. There is a small amount of fluid tracking from the right ovary into the cul-de-sac region. Other:  The appendix appears normal. There is no abscess in the abdomen or pelvis. There is a minimal ventral hernia containing only fat. Musculoskeletal: There are no blastic or lytic bone lesions. There is no intramuscular abdominal wall lesion. IMPRESSION: There is rectal wall thickening with mild perirectal stranding. Suspect a degree of proctitis. Small amount of free fluid tracking from the right ovary to the cul-de-sac. Suspect recent ovarian cyst rupture. No bowel obstruction. No abscess. Appendix appears normal. No renal or ureteral calculus.  No hydronephrosis. Small ventral hernia containing only fat. Electronically Signed   By: Bretta BangWilliam  Woodruff III M.D.   On: 05/05/2015 18:43   I have personally reviewed and evaluated these images and lab results as part of my medical decision-making.   EKG Interpretation None      MDM   Final diagnoses:  Ruptured ovarian cyst  Proctitis    CT reveals e/o recent right ovarian cyst rupture. There is also evidence of proctitis. Pt denies any issues with BM. She declined pain medicine in  the ED. She has had no episodes of emesis. Will treat empirically for possible sexually transmitted etiology of proctitis. Rocephin given in the ED and rx for course of doxy at home. [facility] Rx for naproxen given for pain as well. Pt otherwise hemodynamically stable and stable for discharge. ER return precautions given.   I personally performed the services described in this documentation, which was scribed in my presence. The recorded information has been reviewed and is accurate.   Carlene Coria, Cordelia Poche 05/05/15 2139  Loren Racer, MD 05/06/15 [phone]

## 2018-10-30 ENCOUNTER — Emergency Department (HOSPITAL_COMMUNITY)
Admission: EM | Admit: 2018-10-30 | Discharge: 2018-10-31 | Disposition: A | Discharge status: Home / Self Care | Payer: Medicaid - Out of State | Attending: Emergency Medicine | Admitting: Emergency Medicine

## 2018-10-30 ENCOUNTER — Emergency Department (HOSPITAL_COMMUNITY): Payer: Medicaid - Out of State

## 2018-10-30 ENCOUNTER — Encounter (HOSPITAL_COMMUNITY): Payer: Self-pay | Admitting: Emergency Medicine

## 2018-10-30 ENCOUNTER — Other Ambulatory Visit: Payer: Self-pay

## 2018-10-30 DIAGNOSIS — R42 Dizziness and giddiness: Secondary | ICD-10-CM | POA: Insufficient documentation

## 2018-10-30 DIAGNOSIS — R0602 Shortness of breath: Secondary | ICD-10-CM | POA: Diagnosis present

## 2018-10-30 DIAGNOSIS — R05 Cough: Secondary | ICD-10-CM | POA: Diagnosis not present

## 2018-10-30 DIAGNOSIS — Z5321 Procedure and treatment not carried out due to patient leaving prior to being seen by health care provider: Secondary | ICD-10-CM | POA: Diagnosis not present

## 2018-10-30 NOTE — ED Triage Notes (Signed)
Patient presents with multiple complaints: SOB with occasional dry cough , generalized body numbness and dizziness onset today .

## 2018-10-31 LAB — COMPREHENSIVE METABOLIC PANEL
ALT: 16 U/L (ref 0–44)
AST: 19 U/L (ref 15–41)
Albumin: 4.3 g/dL (ref 3.5–5.0)
Alkaline Phosphatase: 52 U/L (ref 38–126)
Anion gap: 13 (ref 5–15)
BUN: 14 mg/dL (ref 6–20)
CO2: 26 mmol/L (ref 22–32)
Calcium: 10 mg/dL (ref 8.9–10.3)
Chloride: 100 mmol/L (ref 98–111)
Creatinine, Ser: 0.87 mg/dL (ref 0.44–1.00)
GFR calc Af Amer: >60 mL/min (ref >60)
GFR calc non Af Amer: >60 mL/min (ref >60)
Glucose, Bld: 130 mg/dL — ABNORMAL HIGH (ref 70–99)
Potassium: 3.6 mmol/L (ref 3.5–5.1)
Sodium: 139 mmol/L (ref 135–145)
Total Bilirubin: 0.4 mg/dL (ref 0.3–1.2)
Total Protein: 7.5 g/dL (ref 6.5–8.1)

## 2018-10-31 LAB — CBC WITH DIFFERENTIAL/PLATELET
Abs Immature Granulocytes: 0.02 10*3/uL (ref 0.00–0.07)
Basophils Absolute: 0 10*3/uL (ref 0.0–0.1)
Basophils Relative: 1 %
Eosinophils Absolute: 0.1 10*3/uL (ref 0.0–0.5)
Eosinophils Relative: 2 %
HCT: 48.5 % — ABNORMAL HIGH (ref 36.0–46.0)
Hemoglobin: 15.7 g/dL — ABNORMAL HIGH (ref 12.0–15.0)
Immature Granulocytes: 0 %
Lymphocytes Relative: 35 %
Lymphs Abs: 2.1 10*3/uL (ref 0.7–4.0)
MCH: 30.4 pg (ref 26.0–34.0)
MCHC: 32.4 g/dL (ref 30.0–36.0)
MCV: 94 fL (ref 80.0–100.0)
Monocytes Absolute: 0.4 10*3/uL (ref 0.1–1.0)
Monocytes Relative: 7 %
Neutro Abs: 3.4 10*3/uL (ref 1.7–7.7)
Neutrophils Relative %: 55 %
Platelets: 279 10*3/uL (ref 150–400)
RBC: 5.16 MIL/uL — ABNORMAL HIGH (ref 3.87–5.11)
RDW: 11.8 % (ref 11.5–15.5)
WBC: 6 10*3/uL (ref 4.0–10.5)
nRBC: 0 % (ref 0.0–0.2)

## 2018-10-31 LAB — I-STAT BETA HCG BLOOD, ED (MC, WL, AP ONLY): I-stat hCG, quantitative: <5 m[IU]/mL (ref <5)

## 2018-10-31 NOTE — ED Notes (Signed)
Patient called for vitals recheck x1 with no response 

## 2018-10-31 NOTE — ED Notes (Signed)
Patient called for vitals recheck x2 with no response  

## 2020-04-04 IMAGING — DX DG CHEST 2V
2 series · 2 of 2 positions shown · non-contrast
Comparison: Chest radiograph dated 04/14/2011

CLINICAL DATA: 28-year-old female with shortness of breath.

EXAM:
CHEST - 2 VIEW

[chest pa]
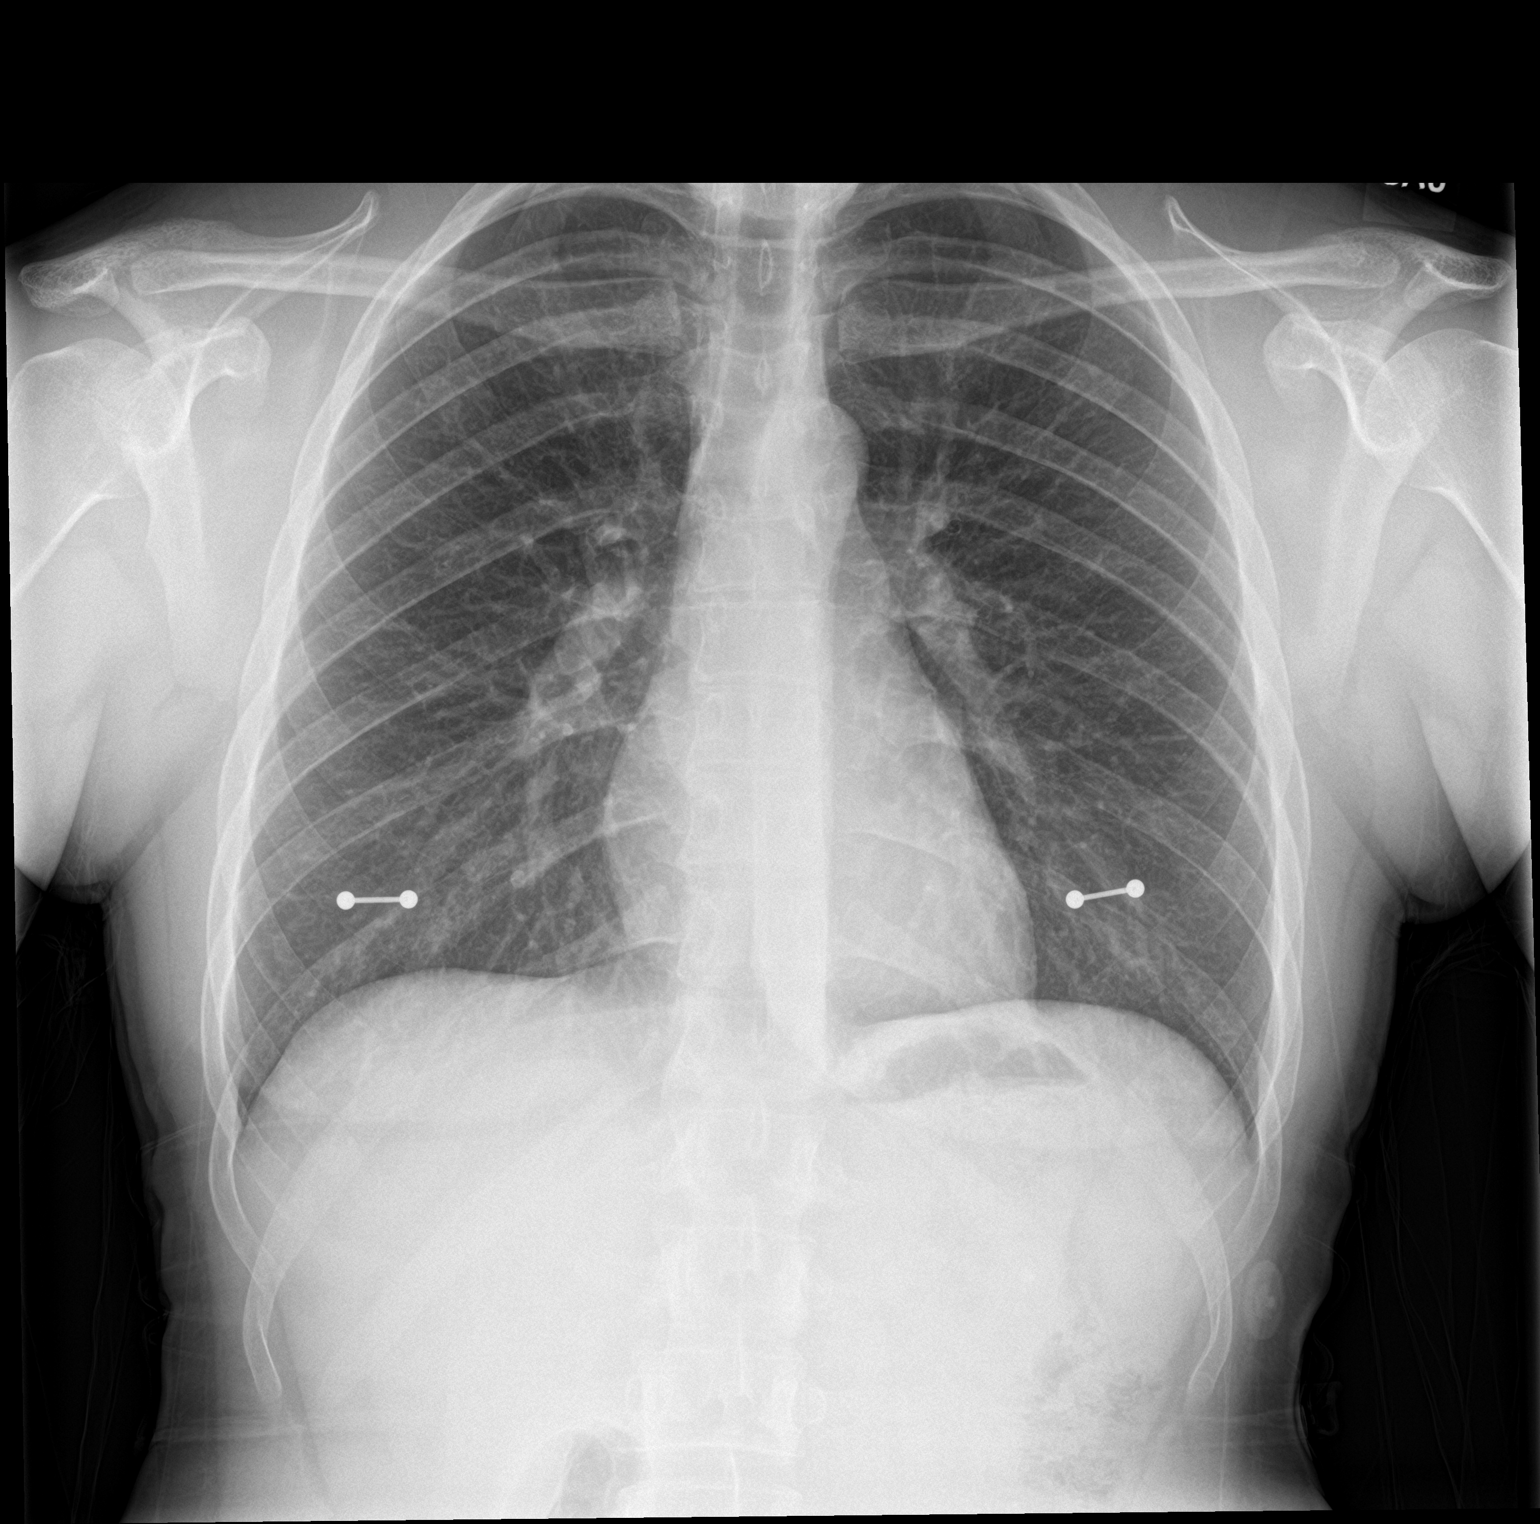

[chest lat]
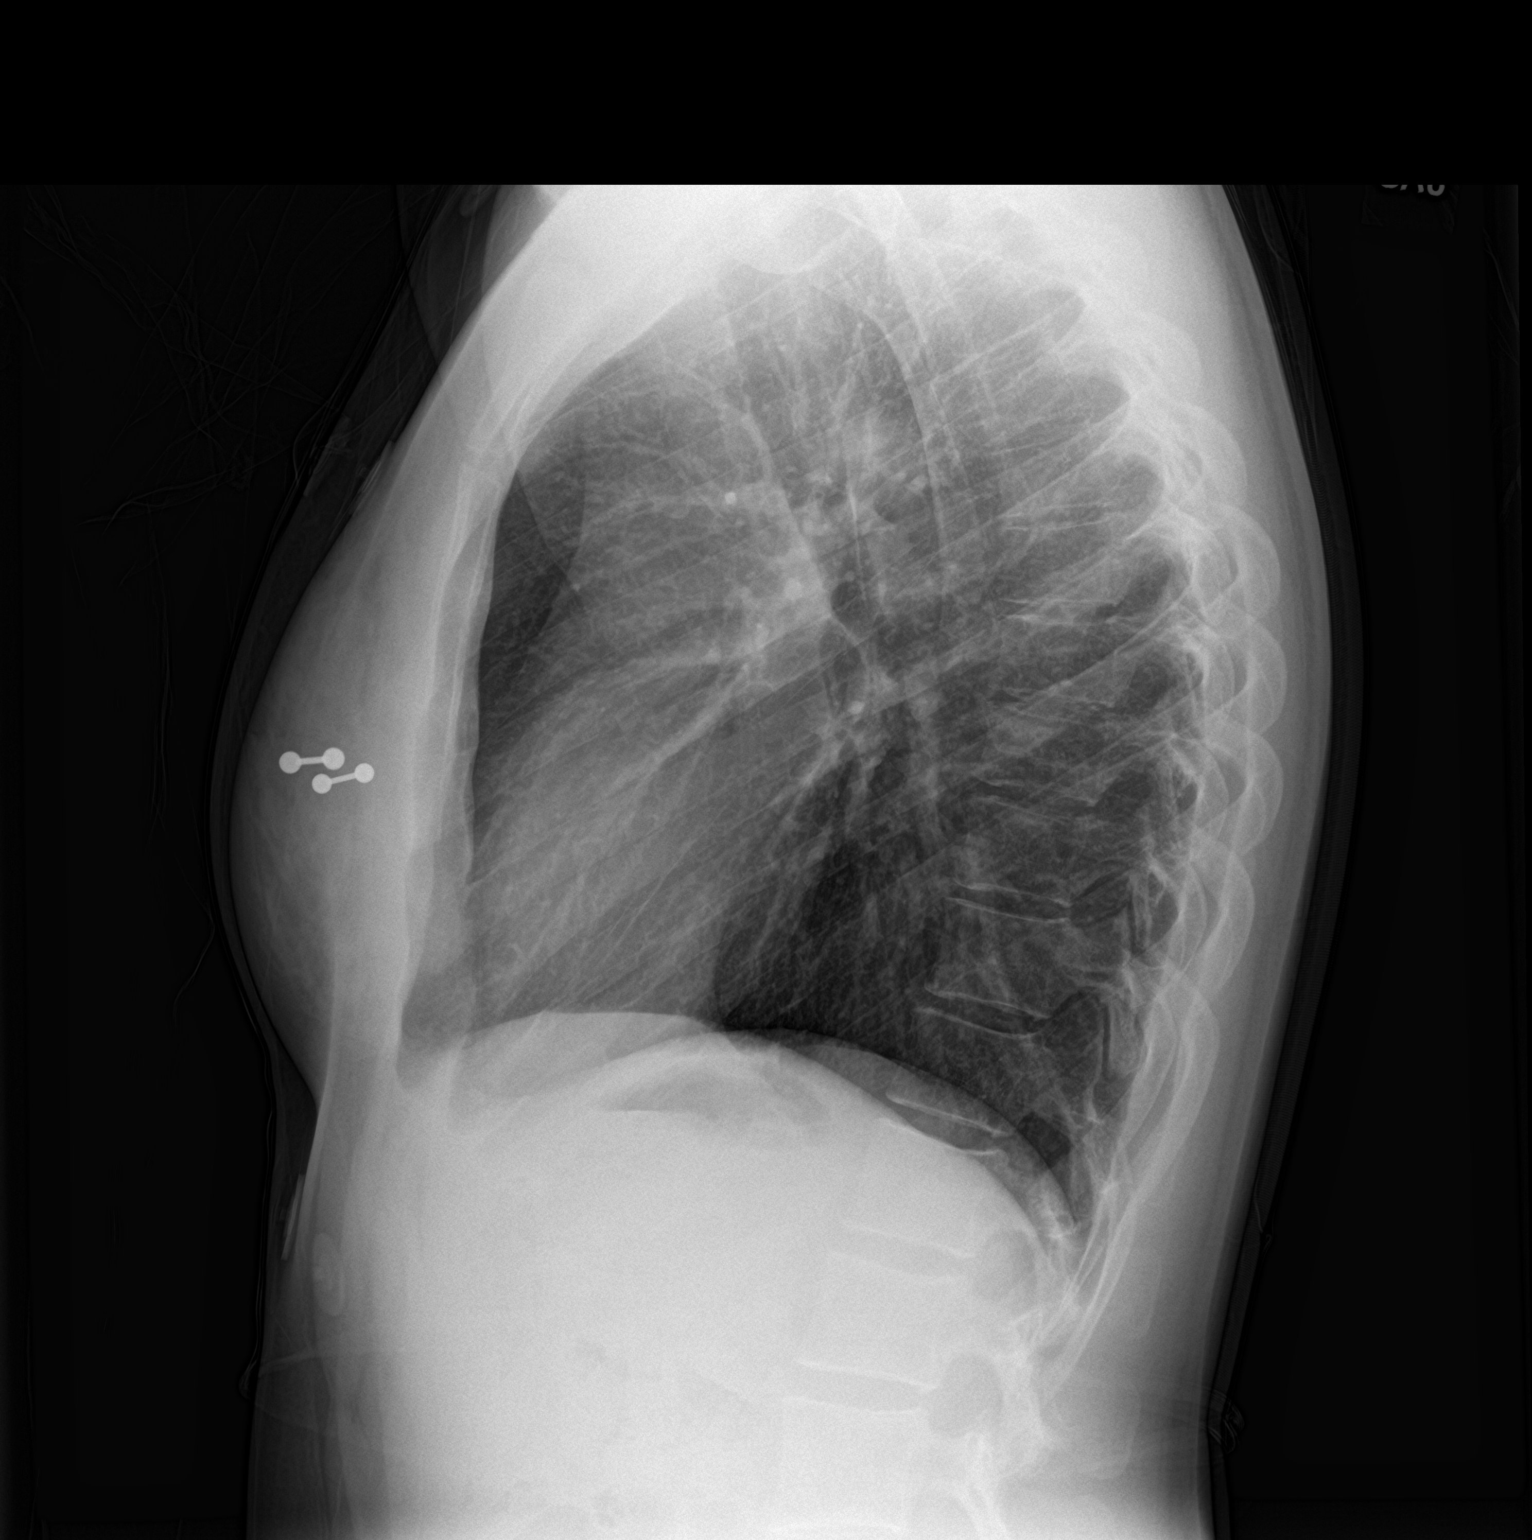

[2 of 2 positions shown; findings below may reference images not displayed]

FINDINGS: The heart size and mediastinal contours are within normal limits.
Both lungs are clear. The visualized skeletal structures are
unremarkable.
IMPRESSION: No active cardiopulmonary disease.

## 2021-12-09 ENCOUNTER — Ambulatory Visit: Payer: Self-pay
